# Patient Record
Sex: Male | Born: 1935
Health system: Southern US, Community
[De-identification: ages and names within clinical notes are randomized; demographics above are authoritative.]

## PROBLEM LIST (undated history)

## (undated) DIAGNOSIS — I10 Essential (primary) hypertension: Secondary | ICD-10-CM

## (undated) DIAGNOSIS — Z8719 Personal history of other diseases of the digestive system: Secondary | ICD-10-CM

## (undated) DIAGNOSIS — Z9889 Other specified postprocedural states: Secondary | ICD-10-CM

## (undated) DIAGNOSIS — I519 Heart disease, unspecified: Secondary | ICD-10-CM

## (undated) DIAGNOSIS — I499 Cardiac arrhythmia, unspecified: Secondary | ICD-10-CM

## (undated) DIAGNOSIS — G4730 Sleep apnea, unspecified: Secondary | ICD-10-CM

## (undated) HISTORY — PX: HERNIA REPAIR: SHX51

## (undated) HISTORY — DX: Sleep apnea, unspecified: G47.30

## (undated) HISTORY — DX: Cardiac arrhythmia, unspecified: I49.9

## (undated) HISTORY — DX: Other specified postprocedural states: Z98.890

## (undated) HISTORY — DX: Personal history of other diseases of the digestive system: Z87.19

## (undated) HISTORY — DX: Essential (primary) hypertension: I10

## (undated) HISTORY — DX: Heart disease, unspecified: I51.9

---

## 2015-02-24 DIAGNOSIS — Z23 Encounter for immunization: Secondary | ICD-10-CM | POA: Diagnosis not present

## 2015-02-25 DIAGNOSIS — I471 Supraventricular tachycardia: Secondary | ICD-10-CM | POA: Diagnosis not present

## 2015-02-25 DIAGNOSIS — I10 Essential (primary) hypertension: Secondary | ICD-10-CM | POA: Diagnosis not present

## 2015-02-25 DIAGNOSIS — R54 Age-related physical debility: Secondary | ICD-10-CM | POA: Diagnosis not present

## 2015-02-25 DIAGNOSIS — R0602 Shortness of breath: Secondary | ICD-10-CM | POA: Diagnosis not present

## 2015-10-19 DIAGNOSIS — Z23 Encounter for immunization: Secondary | ICD-10-CM | POA: Diagnosis not present

## 2015-12-21 DIAGNOSIS — H5213 Myopia, bilateral: Secondary | ICD-10-CM | POA: Diagnosis not present

## 2015-12-21 DIAGNOSIS — H532 Diplopia: Secondary | ICD-10-CM | POA: Diagnosis not present

## 2016-05-26 DIAGNOSIS — I10 Essential (primary) hypertension: Secondary | ICD-10-CM | POA: Diagnosis not present

## 2016-05-26 DIAGNOSIS — Z9989 Dependence on other enabling machines and devices: Secondary | ICD-10-CM | POA: Diagnosis not present

## 2016-05-26 DIAGNOSIS — R54 Age-related physical debility: Secondary | ICD-10-CM | POA: Diagnosis not present

## 2016-05-26 DIAGNOSIS — I471 Supraventricular tachycardia: Secondary | ICD-10-CM | POA: Diagnosis not present

## 2016-05-26 DIAGNOSIS — R0602 Shortness of breath: Secondary | ICD-10-CM | POA: Diagnosis not present

## 2016-05-26 DIAGNOSIS — G4733 Obstructive sleep apnea (adult) (pediatric): Secondary | ICD-10-CM | POA: Diagnosis not present

## 2016-12-19 DIAGNOSIS — I10 Essential (primary) hypertension: Secondary | ICD-10-CM | POA: Diagnosis not present

## 2016-12-19 DIAGNOSIS — G4733 Obstructive sleep apnea (adult) (pediatric): Secondary | ICD-10-CM | POA: Diagnosis not present

## 2016-12-19 DIAGNOSIS — Z9989 Dependence on other enabling machines and devices: Secondary | ICD-10-CM | POA: Diagnosis not present

## 2017-05-25 ENCOUNTER — Ambulatory Visit (INDEPENDENT_AMBULATORY_CARE_PROVIDER_SITE_OTHER): Payer: Medicare Other | Admitting: Family Medicine

## 2017-05-25 ENCOUNTER — Encounter: Payer: Self-pay | Admitting: Family Medicine

## 2017-05-25 VITALS — BP 118/86 | HR 64 | Temp 98.0°F | Resp 22 | Ht 65.0 in | Wt 210.0 lb

## 2017-05-25 DIAGNOSIS — H9193 Unspecified hearing loss, bilateral: Secondary | ICD-10-CM

## 2017-05-25 DIAGNOSIS — I4891 Unspecified atrial fibrillation: Secondary | ICD-10-CM | POA: Insufficient documentation

## 2017-05-25 DIAGNOSIS — I499 Cardiac arrhythmia, unspecified: Secondary | ICD-10-CM

## 2017-05-25 DIAGNOSIS — J302 Other seasonal allergic rhinitis: Secondary | ICD-10-CM | POA: Insufficient documentation

## 2017-05-25 NOTE — Progress Notes (Signed)
Chief Complaint  Patient presents with  . New Patient (Initial Visit)    Establish care with local MD; goes to [facility]       New Patient Visit SUBJECTIVE: HPI: Derrick Spencer is an 84 y.o.male who is being seen for establishing care.  The patient was previously seen at [facility]. Here w daughter.   Pt has a hx of irreg heart beat. Does not remember who told him this or what is done. He does not follow with a cardiologist. Last EKG was around 2 years ago. He will sometimes get short of breath, none currently. No palpitations or chest pain. Reports compliance with his meds, all of which he gets from [place].  He will also wake up iwht a runny nose and drainage in his throat making him cough. He does have a hx of allergies, but does not take anything. He would like something.   Pt is HOH.   Past Medical History:  Diagnosis Date  . Cardiac arrhythmia   . H/O hernia repair   . Heart disease   . High blood pressure   . Sleep apnea    Family History  Problem Relation Age of Onset  . Arthritis Mother   . Hearing loss Mother   . Arthritis Father   . Diabetes Daughter      No Known Allergies  Current Outpatient Medications:  .  Ascorbic Acid (VITAMIN C) 100 MG tablet, Take 100 mg by mouth daily., Disp: , Rfl:  .  aspirin EC 81 MG tablet, Take 81 mg by mouth daily., Disp: , Rfl:  .  furosemide (LASIX) 20 MG tablet, Take 10 mg by mouth daily., Disp: , Rfl:  .  Omega-3 Fatty Acids (FISH OIL) 1000 MG CAPS, Take by mouth daily., Disp: , Rfl:  .  Omeprazole 20 MG TBEC, Take 20 mg by mouth daily as needed., Disp: , Rfl:  .  tamsulosin (FLOMAX) 0.4 MG CAPS capsule, Take 0.4 mg by mouth daily., Disp: , Rfl:  .  valsartan (DIOVAN) 40 MG tablet, Take 40 mg by mouth 2 (two) times daily. Meds delivered as  tabs, pt. Cuts in half, Disp: , Rfl:   ROS Cardiovascular: Denies chest pain  Respiratory: Denies dyspnea   OBJECTIVE: BP 118/86 (BP Location: Right Arm, Patient Position:  Sitting, Cuff Size: Large)   Pulse 64 Comment: irregular  Temp 98 F (36.7 C) (Oral)   Resp (!) 22   Ht  (1.651 m)   Wt 210 lb (95.3 kg)   SpO2 95%   BMI 34.95 kg/m   Constitutional: -  VS reviewed -  Well developed, well nourished, appears stated age -  No apparent distress  Psychiatric: -  Oriented to person, place, and time -  Memory intact -  Affect and mood normal -  Fluent conversation, good eye contact -  Judgment and insight age appropriate  Eye: -  Conjunctivae clear, no discharge -  Pupils symmetric, round, reactive to light  ENMT: -  MMM    Pharynx moist, no exudate, no erythema -  Ears patent, TM's with sclerosis b/l  Neck: -  No gross swelling, no palpable masses -  Thyroid midline, not enlarged, mobile, no palpable masses  Cardiovascular: -  Rate reg, irreg irreg rhythm -  2+ pitting LE edema up to prox 1/3 of tibia bl  Respiratory: -  Normal respiratory effort, no accessory muscle use, no retraction -  Breath sounds equal, no wheezes, no ronchi, no  crackles  Skin: -  No significant lesion on inspection -  Warm and dry to palpation   ASSESSMENT/PLAN: Seasonal allergies  Atrial fibrillation, unspecified type (HCC)  Irregular heartbeat - Plan: EKG 12-Lead  Bilateral hearing loss, unspecified hearing loss type  Patient instructed to sign release of records form from his previous PCP. OTC options for #1 written down. Renal function reviewed from labwork he brought in and GFR >60.  EKG shows atrial fibrillation. He has a cardiologist, pt and daughter rec'd to contact that provider today to see if he already knows about or would like him to be seen. As this has been going on for years, I will hold off on adding anticoagulation without seeing records.  I fear bleeding with him more than anything else due to duration of issue.  [facility]  The patient and his daughter voiced understanding and agreement to  the plan.   Jilda Roche Ansley, DO 05/25/17  10:50 AM

## 2017-05-25 NOTE — Patient Instructions (Addendum)
Claritin (loratadine), Allegra (fexofenadine), Zyrtec (cetirizine); these are listed in order from weakest to strongest. Generic, and therefore cheaper, options are in the parentheses.   There are available OTC, and the generic versions, which may be cheaper, are in parentheses. Show this to a pharmacist if you have trouble finding any of these items.  Call your cardiologist to make sure he knows about the atrial fibrillation/irregular heartbeat.   Let us know if you need anything.  Diastasis Recti Diastasis recti is when the muscles of the abdomen (rectus abdominis muscles) become thin and separate. The result is a wider space between the right and left abdomen (abdominal) muscles. This wider space between the muscles may cause a bulge in the middle of your abdomen. You may notice this bulge when you are straining or when you sit up from a lying down position. Diastasis recti can affect men and women. It is most common among pregnant women, infants, people who are obese, and people who have had abdominal surgery. Exercise or surgical treatment may help correct it. What are the causes? Common causes of this condition include:  Pregnancy. The growing uterus puts pressure on the abdominal muscles, which causes the muscles to separate.  Obesity. Excess fat puts pressure on abdominal muscles.  Weightlifting.  Some abdomen exercises.  Advanced age.  Genetics.  Prior abdominal surgery.  What increases the risk? This condition is more likely to develop in:  Women.  Newborns, especially newborns who are born early (prematurely).  What are the signs or symptoms? Common symptoms of this condition include:  A bulge in the middle of the abdomen. You will notice it most when you sit up or strain.  Pain in the low back, pelvis, or hips.  Constipation.  Inability to control when you urinate (urinary incontinence).  Bloating.  Poor posture.  How is this diagnosed? This condition is  diagnosed with a physical exam. Your health care provider will ask you to lie flat on your back and do a crunch or half sit-up. If you have diastasis recti, a vertical bulge will appear between your abdominal muscles in the center of your abdomen. Your health care provider will measure the gap between your muscles with one of the following:  A medical device used to measure the space between two objects (caliper).  A tape measure.  CT scan.  Ultrasound.  Finger spaces. Your health care provider will measure the space using their fingers.  How is this treated? If your muscle separation is not too large, you may not need treatment. However, if you are a woman who plans to become pregnant again, you should treat this condition before your next pregnancy. Treatment may include:  Physical therapy to strengthen and tighten your abdominal muscles.  Lifestyle changes such as weight loss and exercise.  Over-the-counter pain medicines as needed.  Surgery to correct the separation.  Follow these instructions at home: Activity  Return to your normal activities as told by your health care provider. Ask your health care provider what activities are safe for you.  When lifting weights or doing exercises using your abdominal muscles or the muscles in the center of your body that give stability (core muscles), make sure you are doing your exercises and movements correctly. Proper form can help to prevent the condition from happening again. General instructions  If you are overweight, ask your health care provider for help with weight loss. Losing even a small amount of weight can help to improve your diastasis recti.  Take over-the-counter or prescription medicines only as told by your health care provider.  Do not strain. Straining can make the separation worse. Examples of straining include: ? Pushing hard to have a bowel movement, such as due to constipation. ? Lifting heavy objects, including  children. ? Standing up and sitting down.  Take steps to prevent constipation: ? Drink enough fluid to keep your urine clear or pale yellow. ? Take over-the-counter or prescription medicines only as directed. ? Eat foods that are high in fiber, such as fresh fruits and vegetables, whole grains, and beans. ? Limit foods that are high in fat and processed sugars, such as fried and sweet foods. Contact a health care provider if:  You notice a new bulge in your abdomen. Get help right away if:  You experience severe discomfort in your abdomen.  You develop severe abdominal pain along with nausea, vomiting, or fever. Summary  Diastasis recti is when the abdomen (abdominal) muscles become thin and separate. Your abdomen will stick out because the space between your right and left abdomen muscles has widened.  The most common symptom is a bulge in your abdomen. You will notice it most when you sit up or are straining.  This condition is diagnosed during a physical exam.  If the abdomen separation is not too big, you may choose not to have treatment. Otherwise, you may need to undergo physical therapy or surgery. This information is not intended to replace advice given to you by your health care provider. Make sure you discuss any questions you have with your health care provider. Document Released: 03/06/2016 Document Revised: 03/06/2016 Document Reviewed: 03/06/2016 Elsevier Interactive Patient Education  [employer].

## 2017-06-06 NOTE — Progress Notes (Addendum)
Subjective:   Derrick Spencer is a 84 y.o. male who presents for Medicare Annual Initial preventive examination. The Patient was informed that the wellness visit is to identify future health risk and educate and initiate measures that can reduce risk for increased disease through the lifespan.   Describes health as fair, good or great? "Great for 89" per pt.    Review of Systems: No ROS.  Medicare Wellness Visit. Additional risk factors are reflected in the social history.   Sleep patterns: wears CPAP. Wakes 2-3x/night. Naps 2 hrs daily Home Safety/Smoke Alarms: Feels safe in home. Smoke alarms in place.  Living environment; residence and Firearm Safety: Lives with wife in 1 story home. Walk in shower.   Male:   PSA- No results found for: PSA      Objective:    Vitals: BP 120/60 (BP Location: Left Arm, Patient Position: Sitting, Cuff Size: Normal)   Pulse 78   Ht  (1.651 m)   Wt 210 lb (95.3 kg)   SpO2 96%   BMI 34.95 kg/m   Body mass index is 34.95 kg/m.  Advanced Directives 06/11/2017  Does Patient Have a Medical Advance Directive? Yes  Type of Estate agent of [place];Living will  Does patient want to make changes to medical advance directive? No - Patient declined  Copy of Healthcare Power of Attorney in Chart? No - copy requested    Tobacco Social History   Tobacco Use  Smoking Status Never Smoker  Smokeless Tobacco Never Used     Counseling given: Not Answered   Clinical Intake: Pain : No/denies pain    Past Medical History:  Diagnosis Date  . Cardiac arrhythmia   . H/O hernia repair   . Heart disease   . High blood pressure   . Sleep apnea    CPAP   Past Surgical History:  Procedure Laterality Date  . HERNIA REPAIR     Family History  Problem Relation Age of Onset  . Arthritis Mother   . Hearing loss Mother   . Arthritis Father   . Diabetes Daughter    Social History   Socioeconomic History  . Marital  status: Married    Spouse name: Not on file  . Number of children: Not on file  . Years of education: Not on file  . Highest education level: Not on file  Occupational History  . Not on file  Social Needs  . Financial resource strain: Not on file  . Food insecurity:    Worry: Not on file    Inability: Not on file  . Transportation needs:    Medical: Not on file    Non-medical: Not on file  Tobacco Use  . Smoking status: Never Smoker  . Smokeless tobacco: Never Used  Substance and Sexual Activity  . Alcohol use: Never    Frequency: Never  . Drug use: Never  . Sexual activity: Not on file  Lifestyle  . Physical activity:    Days per week: Not on file    Minutes per session: Not on file  . Stress: Not on file  Relationships  . Social connections:    Talks on phone: Not on file    Gets together: Not on file    Attends religious service: Not on file    Active member of club or organization: Not on file    Attends meetings of clubs or organizations: Not on file    Relationship status: Not on  file  Other Topics Concern  . Not on file  Social History Narrative  . Not on file    Outpatient Encounter Medications as of 06/11/2017  Medication Sig  . Ascorbic Acid (VITAMIN C) 100 MG tablet Take 100 mg by mouth daily.  Marland Kitchen aspirin EC 81 MG tablet Take 81 mg by mouth daily.  . furosemide (LASIX) 20 MG tablet Take 10 mg by mouth daily.  . Omega-3 Fatty Acids (FISH OIL) 1000 MG CAPS Take by mouth daily.  . Omeprazole 20 MG TBEC Take 20 mg by mouth daily as needed.  . tamsulosin (FLOMAX) 0.4 MG CAPS capsule Take 0.4 mg by mouth daily.  . valsartan (DIOVAN) 40 MG tablet Take 40 mg by mouth 2 (two) times daily. Meds delivered as  tabs, pt. Cuts in half   No facility-administered encounter medications on file as of 06/11/2017.     Activities of Daily Living In your present state of health, do you have any difficulty performing the following activities: 06/11/2017 05/25/2017  Hearing?  Malvin Johns  Vision? N N  Comment pt states he has lens implants. -  Difficulty concentrating or making decisions? N N  Walking or climbing stairs? N N  Dressing or bathing? N N  Doing errands, shopping? N N  Preparing Food and eating ? N -  Using the Toilet? N -  In the past six months, have you accidently leaked urine? N -  Do you have problems with loss of bowel control? N -  Managing your Medications? N -  Managing your Finances? N -  Housekeeping or managing your Housekeeping? N -  Comment has Programmer, applications. -    Patient Care Team: Sharlene Dory, DO as PCP - General (Family Medicine)   Assessment:   This is a routine wellness examination for Derrick Spencer. Physical assessment deferred to PCP.  Exercise Activities and Dietary recommendations   Diet (meal preparation, eat out, water intake, caffeinated beverages, dairy products, fruits and vegetables): 24 hr recall Breakfast: cornflakes and fruit. coffee Lunch: sandwich or veg plate Dinner: salad and ice cream.     Goals    . maintain independence       Fall Risk Fall Risk  06/11/2017 05/25/2017  Falls in the past year? No No    Depression Screen PHQ 2/9 Scores 06/11/2017 05/25/2017  PHQ - 2 Score 0 1    Cognitive Function  Pt is alert and oriented x3. Answers questions appropriately.         There is no immunization history on file for this patient.   Screening Tests Health Maintenance  Topic Date Due  . TETANUS/TDAP  04/07/1939  . PNA vac Low Risk Adult (1 of 2 - PCV13) 04/08/1987  . INFLUENZA VACCINE  08/23/2017      Plan:     Please schedule your next medicare wellness visit with me in 1 yr.  Bring a copy of your living will and/or healthcare power of attorney to your next office visit.  Continue to eat heart healthy diet (full of fruits, vegetables, whole grains, lean protein, water--limit salt, fat, and sugar intake) and increase physical activity as tolerated.   I have personally reviewed and noted  the following in the patient's chart:   . Medical and social history . Use of alcohol, tobacco or illicit drugs  . Current medications and supplements . Functional ability and status . Nutritional status . Physical activity . Advanced directives . List of other physicians . Hospitalizations, surgeries,  and ER visits in previous 12 months . Vitals . Screenings to include cognitive, depression, and falls . Referrals and appointments  In addition, I have reviewed and discussed with patient certain preventive protocols, quality metrics, and best practice recommendations. A written personalized care plan for preventive services as well as general preventive health recommendations were provided to patient.     Avon Gully, RN  06/11/2017   I have reviewed the above MWE note by Ms. Marche Hottenstein and agree with her documentation

## 2017-06-08 ENCOUNTER — Ambulatory Visit: Payer: Medicare Other | Admitting: *Deleted

## 2017-06-11 ENCOUNTER — Encounter: Payer: Self-pay | Admitting: *Deleted

## 2017-06-11 ENCOUNTER — Ambulatory Visit (INDEPENDENT_AMBULATORY_CARE_PROVIDER_SITE_OTHER): Payer: Medicare Other | Admitting: *Deleted

## 2017-06-11 VITALS — BP 120/60 | HR 78 | Ht 65.0 in | Wt 210.0 lb

## 2017-06-11 DIAGNOSIS — Z0000 Encounter for general adult medical examination without abnormal findings: Secondary | ICD-10-CM | POA: Diagnosis not present

## 2017-06-11 NOTE — Patient Instructions (Signed)
Please schedule your next medicare wellness visit with me in 1 yr.  Bring a copy of your living will and/or healthcare power of attorney to your next office visit.  Continue to eat heart healthy diet (full of fruits, vegetables, whole grains, lean protein, water--limit salt, fat, and sugar intake) and increase physical activity as tolerated.   Derrick Spencer , Thank you for taking time to come for your Medicare Wellness Visit. I appreciate your ongoing commitment to your health goals. Please review the following plan we discussed and let me know if I can assist you in the future.   These are the goals we discussed: Goals    . maintain independence       This is a list of the screening recommended for you and due dates:  Health Maintenance  Topic Date Due  . Tetanus Vaccine  04/07/1939  . Pneumonia vaccines (1 of 2 - PCV13) 04/08/1987  . Flu Shot  08/23/2017    Health Maintenance, Male A healthy lifestyle and preventive care is important for your health and wellness. Ask your health care provider about what schedule of regular examinations is right for you. What should I know about weight and diet? Eat a Healthy Diet  Eat plenty of vegetables, fruits, whole grains, low-fat dairy products, and lean protein.  Do not eat a lot of foods high in solid fats, added sugars, or salt.  Maintain a Healthy Weight Regular exercise can help you achieve or maintain a healthy weight. You should:  Do at least 150 minutes of exercise each week. The exercise should increase your heart rate and make you sweat (moderate-intensity exercise).  Do strength-training exercises at least twice a week.  Watch Your Levels of Cholesterol and Blood Lipids  Have your blood tested for lipids and cholesterol every 5 years starting at 82 years of age. If you are at high risk for heart disease, you should start having your blood tested when you are 82 years old. You may need to have your cholesterol levels checked  more often if: ? Your lipid or cholesterol levels are high. ? You are older than 82 years of age. ? You are at high risk for heart disease.  What should I know about cancer screening? Many types of cancers can be detected early and may often be prevented. Lung Cancer  You should be screened every year for lung cancer if: ? You are a current smoker who has smoked for at least 30 years. ? You are a former smoker who has quit within the past 15 years.  Talk to your health care provider about your screening options, when you should start screening, and how often you should be screened.  Colorectal Cancer  Routine colorectal cancer screening usually begins at 82 years of age and should be repeated every 5-10 years until you are 82 years old. You may need to be screened more often if early forms of precancerous polyps or small growths are found. Your health care provider may recommend screening at an earlier age if you have risk factors for colon cancer.  Your health care provider may recommend using home test kits to check for hidden blood in the stool.  A small camera at the end of a tube can be used to examine your colon (sigmoidoscopy or colonoscopy). This checks for the earliest forms of colorectal cancer.  Prostate and Testicular Cancer  Depending on your age and overall health, your health care provider may do certain tests to  screen for prostate and testicular cancer.  Talk to your health care provider about any symptoms or concerns you have about testicular or prostate cancer.  Skin Cancer  Check your skin from head to toe regularly.  Tell your health care provider about any new moles or changes in moles, especially if: ? There is a change in a mole's size, shape, or color. ? You have a mole that is larger than a pencil eraser.  Always use sunscreen. Apply sunscreen liberally and repeat throughout the day.  Protect yourself by wearing long sleeves, pants, a wide-brimmed hat,  and sunglasses when outside.  What should I know about heart disease, diabetes, and high blood pressure?  If you are 37-59 years of age, have your blood pressure checked every 3-5 years. If you are 65 years of age or older, have your blood pressure checked every year. You should have your blood pressure measured twice-once when you are at a hospital or clinic, and once when you are not at a hospital or clinic. Record the average of the two measurements. To check your blood pressure when you are not at a hospital or clinic, you can use: ? An automated blood pressure machine at a pharmacy. ? A home blood pressure monitor.  Talk to your health care provider about your target blood pressure.  If you are between 51-15 years old, ask your health care provider if you should take aspirin to prevent heart disease.  Have regular diabetes screenings by checking your fasting blood sugar level. ? If you are at a normal weight and have a low risk for diabetes, have this test once every three years after the age of 36. ? If you are overweight and have a high risk for diabetes, consider being tested at a younger age or more often.  A one-time screening for abdominal aortic aneurysm (AAA) by ultrasound is recommended for men aged 80-75 years who are current or former smokers. What should I know about preventing infection? Hepatitis B If you have a higher risk for hepatitis B, you should be screened for this virus. Talk with your health care provider to find out if you are at risk for hepatitis B infection. Hepatitis C Blood testing is recommended for:  Everyone born from [birth year] through 1963.  Anyone with known risk factors for hepatitis C.  Sexually Transmitted Diseases (STDs)  You should be screened each year for STDs including gonorrhea and chlamydia if: ? You are sexually active and are younger than 82 years of age. ? You are older than 82 years of age and your health care provider tells you that you  are at risk for this type of infection. ? Your sexual activity has changed since you were last screened and you are at an increased risk for chlamydia or gonorrhea. Ask your health care provider if you are at risk.  Talk with your health care provider about whether you are at high risk of being infected with HIV. Your health care provider may recommend a prescription medicine to help prevent HIV infection.  What else can I do?  Schedule regular health, dental, and eye exams.  Stay current with your vaccines (immunizations).  Do not use any tobacco products, such as cigarettes, chewing tobacco, and e-cigarettes. If you need help quitting, ask your health care provider.  Limit alcohol intake to no more than 2 drinks per day. One drink equals 12 ounces of beer, 5 ounces of wine, or 1 ounces of hard liquor.  Do not use street drugs.  Do not share needles.  Ask your health care provider for help if you need support or information about quitting drugs.  Tell your health care provider if you often feel depressed.  Tell your health care provider if you have ever been abused or do not feel safe at home. This information is not intended to replace advice given to you by your health care provider. Make sure you discuss any questions you have with your health care provider. Document Released: 07/08/2007 Document Revised: 09/08/2015 Document Reviewed: 10/13/2014 Elsevier Interactive Patient Education  [employer].

## 2017-11-05 DIAGNOSIS — Z23 Encounter for immunization: Secondary | ICD-10-CM | POA: Diagnosis not present

## 2017-12-10 ENCOUNTER — Ambulatory Visit (INDEPENDENT_AMBULATORY_CARE_PROVIDER_SITE_OTHER): Payer: Medicare Other

## 2017-12-10 ENCOUNTER — Ambulatory Visit (INDEPENDENT_AMBULATORY_CARE_PROVIDER_SITE_OTHER): Payer: Medicare Other | Admitting: Family Medicine

## 2017-12-10 ENCOUNTER — Encounter: Payer: Self-pay | Admitting: Family Medicine

## 2017-12-10 VITALS — BP 138/76 | HR 52 | Temp 97.6°F | Ht 65.0 in | Wt 218.0 lb

## 2017-12-10 DIAGNOSIS — R05 Cough: Secondary | ICD-10-CM | POA: Diagnosis not present

## 2017-12-10 DIAGNOSIS — R0602 Shortness of breath: Secondary | ICD-10-CM

## 2017-12-10 DIAGNOSIS — I509 Heart failure, unspecified: Secondary | ICD-10-CM

## 2017-12-10 DIAGNOSIS — R059 Cough, unspecified: Secondary | ICD-10-CM

## 2017-12-10 DIAGNOSIS — M545 Low back pain, unspecified: Secondary | ICD-10-CM

## 2017-12-10 LAB — BASIC METABOLIC PANEL
BUN: 18 mg/dL (ref 6–23)
CALCIUM: 9 mg/dL (ref 8.4–10.5)
CO2: 30 mEq/L (ref 19–32)
Chloride: 102 mEq/L (ref 96–112)
Creatinine, Ser: 0.99 mg/dL (ref 0.40–1.50)
GFR: 74.55 mL/min (ref >60.00)
GLUCOSE: 115 mg/dL — AB (ref 70–99)
Potassium: 4.1 mEq/L (ref 3.5–5.1)
SODIUM: 139 meq/L (ref 135–145)

## 2017-12-10 LAB — POCT URINALYSIS DIPSTICK
Bilirubin, UA: NEGATIVE
Blood, UA: NEGATIVE
Glucose, UA: NEGATIVE
Ketones, UA: NEGATIVE
LEUKOCYTES UA: NEGATIVE
NITRITE UA: NEGATIVE
PH UA: 6 (ref 5.0–8.0)
PROTEIN UA: NEGATIVE
Spec Grav, UA: 1.015 (ref 1.010–1.025)
UROBILINOGEN UA: 0.2 U/dL

## 2017-12-10 NOTE — Assessment & Plan Note (Signed)
-  History and clinical exam consistent with CHF exacerbation.  -He will increase furosemide to 40mg  daily -Check BMP for baseline potassium and renal function.  -CXR ordered -Instructed to weight daily and follow low salt diet.  -Instructed to f/u with cardiologist.  -F/u with PCP in 1 week or sooner for worsening symptoms.

## 2017-12-10 NOTE — Assessment & Plan Note (Signed)
Check UA 

## 2017-12-10 NOTE — Progress Notes (Signed)
Derrick Spencer - 84 y.o. male MRN [id number]  Date of birth: 18-Feb-1920  Subjective Chief Complaint  Patient presents with  . Cough    Ongoing for one week-admits to productive cough. Denies wheezing. He has been taking Alka-seltzer. Denies fevers  . Hip Pain    Right-noticed the pain yesterday. Denies injury or falls.    HPI 84 year old patient presents today accompanied by daughter for cough that started 1 week ago. Patient reports cough to be productive occasionally that is white, yellow. Pt denies fevers, body aches, congestion, or N/V/D. Pt and daughter report history of congestive heart failure but no recent ECHO available. Previous EKG showed atrial fibrillation. Pt states it has been at least a year since being seen by cardiologist. Patient reports occasional shortness of breath. He denies chest pain, pressure, or palpitations.   Additionally, patient reports lower back pain that started 2 days ago. He denies dysuria or hematuria. He denies urinary frequency or urgency.    No Known Allergies  Past Medical History:  Diagnosis Date  . Cardiac arrhythmia   . H/O hernia repair   . Heart disease   . High blood pressure   . Sleep apnea    CPAP    Past Surgical History:  Procedure Laterality Date  . HERNIA REPAIR      Social History   Socioeconomic History  . Marital status: Married    Spouse name: Not on file  . Number of children: Not on file  . Years of education: Not on file  . Highest education level: Not on file  Occupational History  . Not on file  Social Needs  . Financial resource strain: Not on file  . Food insecurity:    Worry: Not on file    Inability: Not on file  . Transportation needs:    Medical: Not on file    Non-medical: Not on file  Tobacco Use  . Smoking status: Never Smoker  . Smokeless tobacco: Never Used  Substance and Sexual Activity  . Alcohol use: Never    Frequency: Never  . Drug use: Never  . Sexual activity: Not on file    Lifestyle  . Physical activity:    Days per week: Not on file    Minutes per session: Not on file  . Stress: Not on file  Relationships  . Social connections:    Talks on phone: Not on file    Gets together: Not on file    Attends religious service: Not on file    Active member of club or organization: Not on file    Attends meetings of clubs or organizations: Not on file    Relationship status: Not on file  Other Topics Concern  . Not on file  Social History Narrative  . Not on file    Family History  Problem Relation Age of Onset  . Arthritis Mother   . Hearing loss Mother   . Arthritis Father   . Diabetes Daughter     Health Maintenance  Topic Date Due  . Derrick Spencer  04/07/1939  . INFLUENZA VACCINE  Completed  . PNA vac Low Risk Adult  Completed   ROS General: Pt reports decreased energy level. Pt denies taking weights and unsure about weight gain or loss.  Cardiac: Pt denies chest pain, pressure, or palpitations. Pt denies syncope or dizziness.  Pulmonary: Pt reports sleeping on one pillow at night.  GU: see HPI   ----------------------------------------------------------------------------------------------------------------------------------------------------------------------------------------------------------------- Physical Exam BP 138/76  Pulse (!) 52   Temp 97.6 F (36.4 C) (Oral)   Ht 5\' 5"  (1.651 m)   Wt 218 lb (98.9 kg)   SpO2 98%   BMI 36.28 kg/m   Physical Exam  Constitutional: He appears well-developed and well-nourished.  HENT:  Mouth/Throat: Oropharynx is clear and moist. No oropharyngeal exudate.  Eyes: Conjunctivae are normal.  Cardiovascular: Normal heart sounds. An irregular rhythm present.  Pulmonary/Chest: No respiratory distress. He has rales in the right lower field and the left lower field.  Lymphadenopathy:    He has no cervical adenopathy.     ------------------------------------------------------------------------------------------------------------------------------------------------------------------------------------------------------------------- Assessment and Plan  Acute on chronic congestive heart failure -Increase lasix to 40mg  daily -Labs: BMP, chest x ray -Begin taking daily weight. Please notify if weights are increasing.  -Follow up with cardiology -Follow up with PCP in 1 week. Return sooner if symptoms worsen or do not improve. Warning signs and symptoms reviewed.   Low back pain -U/a

## 2017-12-10 NOTE — Patient Instructions (Addendum)
-  Increase your furosemide to 40mg  daily -Check weights daily.  Please let me know if your weight is going up.  -Follow a low salt diet. -We'll call with xray results.

## 2017-12-10 NOTE — Progress Notes (Signed)
Derrick Spencer - 84 y.o. male MRN [id number]  Date of birth: December 04, 1920  Subjective Chief Complaint  Patient presents with  . Cough    Ongoing for one week-admits to productive cough. Denies wheezing. He has been taking Alka-seltzer. Denies fevers  . Hip Pain    Right-noticed the pain yesterday. Denies injury or falls.    HPI Derrick Spencer is a 84 y.o. male here today with complaint of cough.  He reports that cough started about 1 week ago.  It is productive of white-light yellow sputum.  He also feels a little short of breath.  He has history of A. Fib based on EKG from 05/25/17.  He also reports diagnosis of CHF, however I do not see recent ECHO.  He is followed by cardiology, it appears last visit was 05/2016 and was seen for PSVT.  I do not see prior diagnosis of A. Fib before visit with PCP on 05/25/17.  He does receive medications from [facility] pharmacy and is taking furosemide 20mg  (prescribed as 10mg ) nightly and valsartan.  He denies chest pain, fever, chills, dizziness, palpitations, wheezing,  orthopnea or increased fatigue.   He also reports some pain in R lower back/posterior hip area.  Denies pain with urination or increased frequency.   ROS:  A comprehensive ROS was completed and negative except as noted per HPI  No Known Allergies  Past Medical History:  Diagnosis Date  . Cardiac arrhythmia   . H/O hernia repair   . Heart disease   . High blood pressure   . Sleep apnea    CPAP    Past Surgical History:  Procedure Laterality Date  . HERNIA REPAIR      Social History   Socioeconomic History  . Marital status: Married    Spouse name: Not on file  . Number of children: Not on file  . Years of education: Not on file  . Highest education level: Not on file  Occupational History  . Not on file  Social Needs  . Financial resource strain: Not on file  . Food insecurity:    Worry: Not on file    Inability: Not on file  . Transportation needs:    Medical: Not on file   Non-medical: Not on file  Tobacco Use  . Smoking status: Never Smoker  . Smokeless tobacco: Never Used  Substance and Sexual Activity  . Alcohol use: Never    Frequency: Never  . Drug use: Never  . Sexual activity: Not on file  Lifestyle  . Physical activity:    Days per week: Not on file    Minutes per session: Not on file  . Stress: Not on file  Relationships  . Social connections:    Talks on phone: Not on file    Gets together: Not on file    Attends religious service: Not on file    Active member of club or organization: Not on file    Attends meetings of clubs or organizations: Not on file    Relationship status: Not on file  Other Topics Concern  . Not on file  Social History Narrative  . Not on file    Family History  Problem Relation Age of Onset  . Arthritis Mother   . Hearing loss Mother   . Arthritis Father   . Diabetes Daughter     Health Maintenance  Topic Date Due  . Janet Berlin  04/07/1939  . INFLUENZA VACCINE  Completed  . PNA  vac Low Risk Adult  Completed    ----------------------------------------------------------------------------------------------------------------------------------------------------------------------------------------------------------------- Physical Exam BP 138/76   Pulse (!) 52   Temp 97.6 F (36.4 C) (Oral)   Ht 5\' 5"  (1.651 m)   Wt 218 lb (98.9 kg)   SpO2 98%   BMI 36.28 kg/m   Physical Exam  Constitutional: He is oriented to person, place, and time. He appears well-nourished. No distress.  HENT:  Head: Normocephalic and atraumatic.  Mouth/Throat: Oropharynx is clear and moist.  Eyes: No scleral icterus.  Neck: Neck supple. No thyromegaly present.  Cardiovascular: An irregularly irregular rhythm present.  Pulmonary/Chest: Effort normal.  Bibasilar crackles   Lymphadenopathy:    He has no cervical adenopathy.  Neurological: He is alert and oriented to person, place, and time.  Skin: Skin is warm and dry.   Psychiatric: He has a normal mood and affect. His behavior is normal.    ------------------------------------------------------------------------------------------------------------------------------------------------------------------------------------------------------------------- Assessment and Plan  CHF exacerbation (HCC) -History and clinical exam consistent with CHF exacerbation.  -He will increase furosemide to 40mg  daily -Check BMP for baseline potassium and renal function.  -CXR ordered -Instructed to weight daily and follow low salt diet.  -Instructed to f/u with cardiologist.  -F/u with PCP in 1 week or sooner for worsening symptoms.   Acute low back pain -Check UA

## 2017-12-11 ENCOUNTER — Telehealth: Payer: Self-pay | Admitting: Emergency Medicine

## 2017-12-11 NOTE — Progress Notes (Signed)
Urine, kidney function and CXR look ok.  Recommend that he continue increased lasix for now and f/u with PCP next week.

## 2017-12-11 NOTE — Telephone Encounter (Signed)
Copied from CRM [phone]. Topic: Quick Communication - Lab Results (Clinic Use ONLY) >> Dec 11, 2017  9:17 AM Leafy Roobinson, Norma J wrote: Pt daughter Darel Hongjudy is calling she in on DPR and would like to know the xray results and urine test results. Dr saw dr Ashley Royaltymatthews yesterday   Left a VM for daughter to give the office a call back.

## 2017-12-12 ENCOUNTER — Ambulatory Visit: Payer: Medicare Other | Admitting: Family Medicine

## 2018-06-11 ENCOUNTER — Telehealth: Payer: Self-pay | Admitting: Family Medicine

## 2018-06-11 NOTE — Telephone Encounter (Signed)
His Daughter called back and stated he is doing better and they think just hemorrhoids

## 2018-06-11 NOTE — Telephone Encounter (Signed)
Noted  

## 2018-06-11 NOTE — Telephone Encounter (Signed)
Pt's daughter is needing advice on what to do concerning pt's issue, states pt has blood in stool and has had it once before and it was a hemmrhoid pt unable to do a doxy.me because daughter is not home with him at this time. Please advise Please call daughter Derrick Spencer) at [phone] she is on his HIPPA

## 2018-06-11 NOTE — Telephone Encounter (Signed)
Yes, please. Ty.

## 2018-06-11 NOTE — Telephone Encounter (Signed)
Last appt with PCP 05/2017 Have not called this patient yet do you want to see in the office on Wednesday?

## 2018-06-13 ENCOUNTER — Ambulatory Visit: Payer: Medicare Other | Admitting: *Deleted

## 2018-08-14 ENCOUNTER — Telehealth: Payer: Self-pay | Admitting: Family Medicine

## 2018-08-14 NOTE — Telephone Encounter (Signed)
Called the daughter and discussed form. The form was from the company that supplies his oxygen. I did inform the daughter he needs appt with PCP has not been seen since 05/2017 and needs appt and she can bring in the forms.  She will call back to schedulel

## 2018-08-14 NOTE — Telephone Encounter (Signed)
Patient's daughter would like a call back to discuss forms from [employer] regarding sleep apnea test and oxygen. She's confused about what the patient needs to do in order to have this completed. Please advise.

## 2018-08-20 ENCOUNTER — Telehealth: Payer: Self-pay | Admitting: Family Medicine

## 2018-08-20 NOTE — Telephone Encounter (Signed)
Please see if Derrick Spencer is still being seen here. He is due for AWV w Glenard Haring and also we are receiving a request for an order for oxygen. If he needs this, we need to have him come in for appt to document his oxygen levels. Ty.

## 2018-08-20 NOTE — Telephone Encounter (Signed)
Called and the daughter has scheduled appt already for next week with PCP.

## 2018-08-22 ENCOUNTER — Telehealth: Payer: Self-pay | Admitting: Family Medicine

## 2018-08-22 ENCOUNTER — Other Ambulatory Visit: Payer: Self-pay

## 2018-08-22 NOTE — Telephone Encounter (Signed)
[facility] calling Derrick Spencer) Pt coming in next week for appt with Dr Viona Gilmore. Daughter is wanting on full time oxygen, [employer] is stating that level of oxygen for nocturnal would warrant another sleep study in order for it to be covered by insurance if Questions call Derrick Spencer at [phone]

## 2018-08-22 NOTE — Telephone Encounter (Signed)
Noted. Will discuss at that time.  

## 2018-08-22 NOTE — Telephone Encounter (Signed)
Looks like patient will be in next week to see you.

## 2018-08-26 ENCOUNTER — Ambulatory Visit: Payer: Medicare Other | Admitting: Family Medicine

## 2018-08-27 ENCOUNTER — Other Ambulatory Visit: Payer: Self-pay

## 2018-08-27 ENCOUNTER — Ambulatory Visit (INDEPENDENT_AMBULATORY_CARE_PROVIDER_SITE_OTHER): Payer: Medicare Other | Admitting: Family Medicine

## 2018-08-27 ENCOUNTER — Encounter: Payer: Self-pay | Admitting: Family Medicine

## 2018-08-27 VITALS — BP 132/82 | HR 98 | Temp 98.0°F | Ht 64.0 in | Wt 213.0 lb

## 2018-08-27 DIAGNOSIS — G4733 Obstructive sleep apnea (adult) (pediatric): Secondary | ICD-10-CM

## 2018-08-27 DIAGNOSIS — Z9989 Dependence on other enabling machines and devices: Secondary | ICD-10-CM

## 2018-08-27 DIAGNOSIS — Z23 Encounter for immunization: Secondary | ICD-10-CM

## 2018-08-27 NOTE — Progress Notes (Signed)
CC: F/u  Subjective: Patient is a 85 y.o. male here for OSA. Here w daughter.  Pt was hx of OSA on CPAP nightly. Has not had specialist in many years. Last sleep study was around 10 yrs ago. Ins requesting titrated sleep study. He does not use O2 during the day.   ROS: Heart: Denies chest pain  Lungs: Denies SOB   Past Medical History:  Diagnosis Date  . Cardiac arrhythmia   . H/O hernia repair   . Heart disease   . High blood pressure   . Sleep apnea    CPAP    Objective: BP 132/82 (BP Location: Left Arm, Patient Position: Sitting, Cuff Size: Large)   Pulse 98   Temp 98 F (36.7 C) (Oral)   Ht 5\' 4"  (1.626 m)   Wt 213 lb (96.6 kg)   SpO2 94%   BMI 36.56 kg/m  General: Awake, appears stated age HEENT: MMM, EOMi Heart: RRR, 2+ pitting LE edema Lungs: CTAB, no rales, wheezes or rhonchi. No accessory muscle use Psych: Age appropriate judgment and insight, normal affect and mood  Assessment and Plan: OSA on CPAP - Plan: Ambulatory referral to Pulmonology, will [facility] to get [facility] first, if wait is too long, will refer to [facility]. Resting and ambulatory O2 sats unremarkable.   F/u in 6 mo for AWV w Glenard Haring, prn w me.  The patient voiced understanding and agreement to the plan.  [place], DO 08/27/18  3:37 PM

## 2018-08-27 NOTE — Patient Instructions (Addendum)
If you do not hear anything about your referral in the next 1-2 weeks, call our office and ask for an update.  When Dr. Elonda Husky moves back to the area, let me know and I can put in a referral.   Aim to do some physical exertion for 150 minutes per week. This is typically divided into 5 days per week, 30 minutes per day. The activity should be enough to get your heart rate up. Anything is better than nothing if you have time constraints.  Let us know if you need anything.

## 2018-10-16 DIAGNOSIS — Z23 Encounter for immunization: Secondary | ICD-10-CM | POA: Diagnosis not present

## 2018-10-28 DIAGNOSIS — I509 Heart failure, unspecified: Secondary | ICD-10-CM | POA: Diagnosis not present

## 2018-10-28 DIAGNOSIS — I471 Supraventricular tachycardia: Secondary | ICD-10-CM | POA: Diagnosis not present

## 2018-10-31 DIAGNOSIS — I517 Cardiomegaly: Secondary | ICD-10-CM | POA: Diagnosis not present

## 2018-10-31 DIAGNOSIS — I083 Combined rheumatic disorders of mitral, aortic and tricuspid valves: Secondary | ICD-10-CM | POA: Diagnosis not present

## 2019-02-13 DIAGNOSIS — I509 Heart failure, unspecified: Secondary | ICD-10-CM | POA: Diagnosis not present

## 2019-02-13 DIAGNOSIS — I471 Supraventricular tachycardia: Secondary | ICD-10-CM | POA: Diagnosis not present

## 2019-02-27 ENCOUNTER — Ambulatory Visit: Payer: Medicare Other | Admitting: *Deleted

## 2019-03-06 DIAGNOSIS — I509 Heart failure, unspecified: Secondary | ICD-10-CM | POA: Diagnosis not present

## 2019-03-06 DIAGNOSIS — I471 Supraventricular tachycardia: Secondary | ICD-10-CM | POA: Diagnosis not present

## 2019-07-29 IMAGING — DX DG CHEST 2V
2 series · 2 of 2 positions shown · non-contrast
Comparison: CT scan February 18, 2011

CLINICAL DATA: Nonproductive cough. Shortness of breath and back
pain.

EXAM:
CHEST - 2 VIEW

[chest pa]
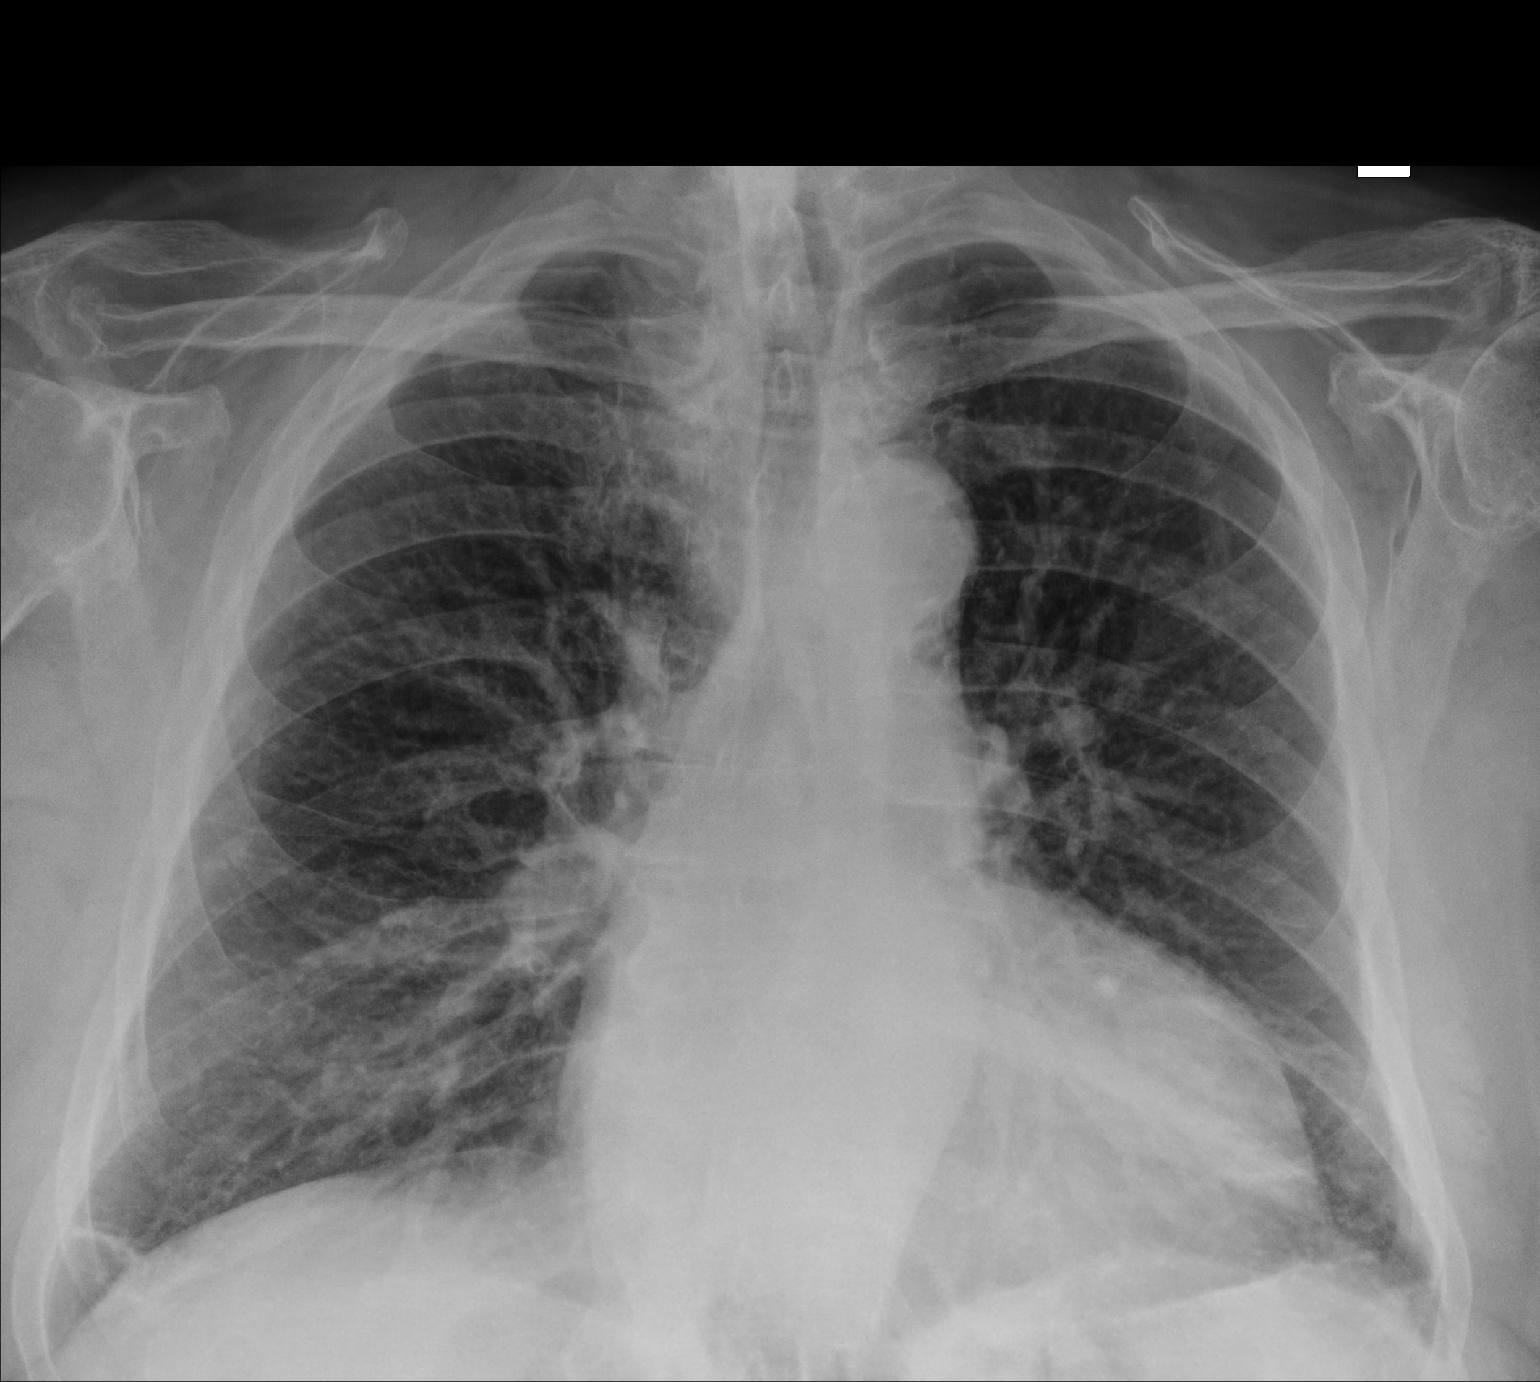

[chest lat]
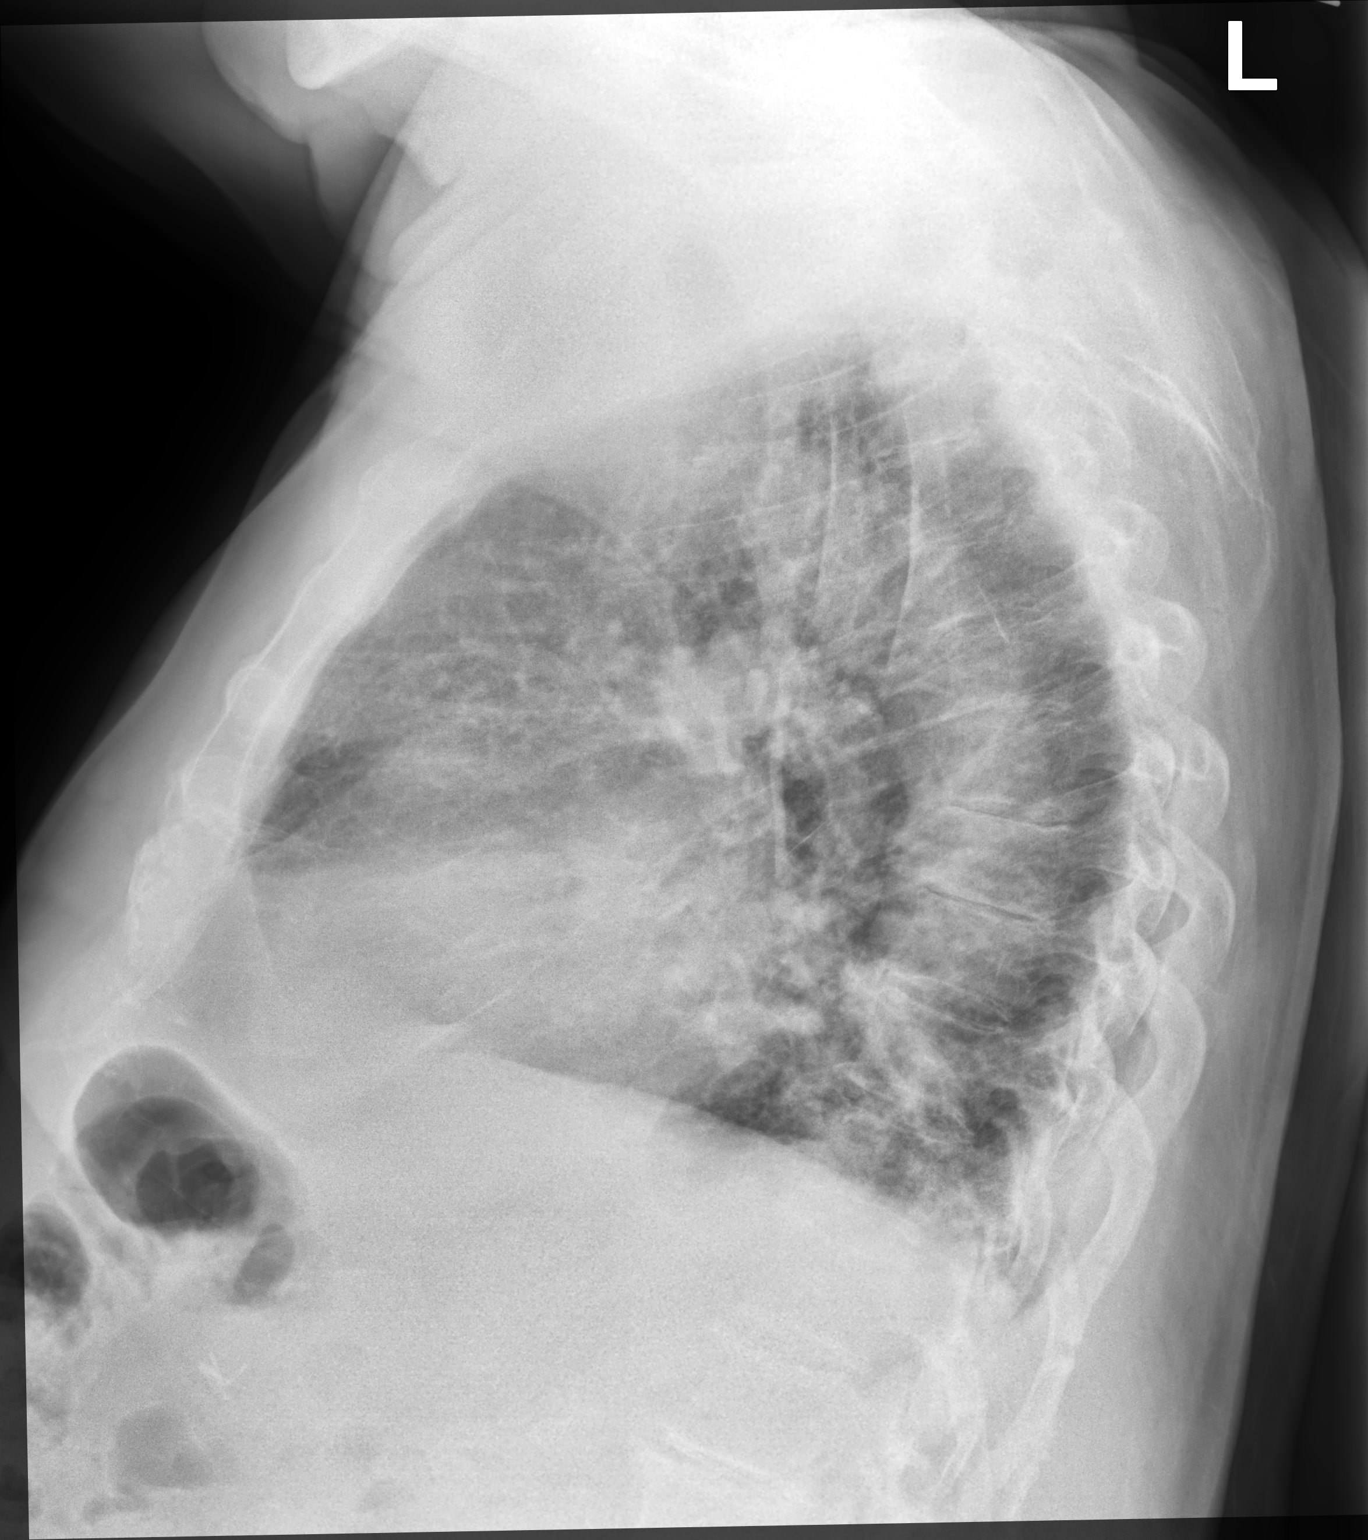

[2 of 2 positions shown; findings below may reference images not displayed]

FINDINGS: Stable cardiomegaly. The hila and mediastinum are normal. Vascular
crowding in the medial right lung base. No pulmonary nodules,
masses, or suspicious infiltrates.
IMPRESSION: No active cardiopulmonary disease.

## 2019-08-10 ENCOUNTER — Emergency Department (HOSPITAL_BASED_OUTPATIENT_CLINIC_OR_DEPARTMENT_OTHER)
Admission: EM | Admit: 2019-08-10 | Discharge: 2019-08-10 | Disposition: A | Discharge status: Home / Self Care | Payer: No Typology Code available for payment source | Attending: Emergency Medicine | Admitting: Emergency Medicine

## 2019-08-10 ENCOUNTER — Encounter (HOSPITAL_BASED_OUTPATIENT_CLINIC_OR_DEPARTMENT_OTHER): Payer: Self-pay | Admitting: Emergency Medicine

## 2019-08-10 ENCOUNTER — Other Ambulatory Visit: Payer: Self-pay

## 2019-08-10 DIAGNOSIS — K625 Hemorrhage of anus and rectum: Secondary | ICD-10-CM | POA: Insufficient documentation

## 2019-08-10 DIAGNOSIS — I4891 Unspecified atrial fibrillation: Secondary | ICD-10-CM | POA: Diagnosis not present

## 2019-08-10 DIAGNOSIS — I509 Heart failure, unspecified: Secondary | ICD-10-CM | POA: Insufficient documentation

## 2019-08-10 LAB — CBC WITH DIFFERENTIAL/PLATELET
Abs Immature Granulocytes: 0.04 10*3/uL (ref 0.00–0.07)
Basophils Absolute: 0.1 10*3/uL (ref 0.0–0.1)
Basophils Relative: 1 %
Eosinophils Absolute: 0.3 10*3/uL (ref 0.0–0.5)
Eosinophils Relative: 4 %
HCT: 35.8 % — ABNORMAL LOW (ref 39.0–52.0)
Hemoglobin: 11.9 g/dL — ABNORMAL LOW (ref 13.0–17.0)
Immature Granulocytes: 0 %
Lymphocytes Relative: 24 %
Lymphs Abs: 2.2 10*3/uL (ref 0.7–4.0)
MCH: 30.4 pg (ref 26.0–34.0)
MCHC: 33.2 g/dL (ref 30.0–36.0)
MCV: 91.3 fL (ref 80.0–100.0)
Monocytes Absolute: 0.8 10*3/uL (ref 0.1–1.0)
Monocytes Relative: 8 %
Neutro Abs: 5.7 10*3/uL (ref 1.7–7.7)
Neutrophils Relative %: 63 %
Platelets: 184 10*3/uL (ref 150–400)
RBC: 3.92 MIL/uL — ABNORMAL LOW (ref 4.22–5.81)
RDW: 13 % (ref 11.5–15.5)
WBC: 9.2 10*3/uL (ref 4.0–10.5)
nRBC: 0 % (ref 0.0–0.2)

## 2019-08-10 LAB — COMPREHENSIVE METABOLIC PANEL
ALT: 12 U/L (ref 0–44)
AST: 18 U/L (ref 15–41)
Albumin: 3.6 g/dL (ref 3.5–5.0)
Alkaline Phosphatase: 52 U/L (ref 38–126)
Anion gap: 11 (ref 5–15)
BUN: 27 mg/dL — ABNORMAL HIGH (ref 8–23)
CO2: 26 mmol/L (ref 22–32)
Calcium: 8.5 mg/dL — ABNORMAL LOW (ref 8.9–10.3)
Chloride: 99 mmol/L (ref 98–111)
Creatinine, Ser: 1.06 mg/dL (ref 0.61–1.24)
GFR calc Af Amer: >60 mL/min (ref >60)
GFR calc non Af Amer: 59 mL/min — ABNORMAL LOW (ref >60)
Glucose, Bld: 110 mg/dL — ABNORMAL HIGH (ref 70–99)
Potassium: 4.3 mmol/L (ref 3.5–5.1)
Sodium: 136 mmol/L (ref 135–145)
Total Bilirubin: 0.4 mg/dL (ref 0.3–1.2)
Total Protein: 6.8 g/dL (ref 6.5–8.1)

## 2019-08-10 LAB — PROTIME-INR
INR: 1.1 (ref 0.8–1.2)
Prothrombin Time: 13.3 seconds (ref 11.4–15.2)

## 2019-08-10 LAB — APTT: aPTT: 30 seconds (ref 24–36)

## 2019-08-10 LAB — OCCULT BLOOD X 1 CARD TO LAB, STOOL: Fecal Occult Bld: POSITIVE — AB

## 2019-08-10 MED ORDER — HYDROCORTISONE ACETATE 25 MG RE SUPP: 25.0000 mg | Freq: Two times a day (BID) | RECTAL | 0 refills | Status: AC

## 2019-08-10 NOTE — Discharge Instructions (Signed)
If the bleeding becomes severe or if you start feeling lightheaded, winded, weak or having any passing out you need to return immediately.

## 2019-08-10 NOTE — ED Triage Notes (Signed)
Pt reports rectal bleeding since yesterday. States bright red and dark stools. Denies pain.

## 2019-08-10 NOTE — ED Provider Notes (Signed)
[facility] EMERGENCY DEPARTMENT Provider Note   CSN: [id number] Arrival date & time: 08/10/19  2008     History Chief Complaint  Patient presents with  . Rectal Bleeding    Derrick Spencer is a 86 y.o. male.  The history is provided by the patient.  Rectal Bleeding Quality: bright and dark red blood. Amount:  Moderate Duration:  2 days Timing:  Intermittent Chronicity:  New Context: spontaneously   Context: not rectal pain   Similar prior episodes: yes (hemorrhoids years ago but nothing recently)   Relieved by:  None tried Worsened by:  Nothing Ineffective treatments:  None tried Associated symptoms: no abdominal pain, no dizziness, no fever, no light-headedness and no loss of consciousness   Associated symptoms comment:  Patient has had 5 episodes of bloody stool total.  He states today the last bloody bowel movement he had was around 3:00.  However today at day the blood looked dark.  He has not noticed if there is been any clots.  Patient does not take anticoagulants Risk factors comment:  Patient with history of atrial fibrillation but does not take anticoagulation.      Past Medical History:  Diagnosis Date  . Cardiac arrhythmia   . H/O hernia repair   . Heart disease   . High blood pressure   . Sleep apnea    CPAP    Patient Active Problem List   Diagnosis Date Noted  . OSA on CPAP 08/27/2018  . CHF exacerbation (HCC) 12/10/2017  . Acute low back pain 12/10/2017  . Irregular heartbeat 05/25/2017  . Seasonal allergies 05/25/2017  . Bilateral hearing loss 05/25/2017  . Atrial fibrillation (HCC) 05/25/2017    Past Surgical History:  Procedure Laterality Date  . HERNIA REPAIR         Family History  Problem Relation Age of Onset  . Arthritis Mother   . Hearing loss Mother   . Arthritis Father   . Diabetes Daughter     Social History   Tobacco Use  . Smoking status: Never Smoker  . Smokeless tobacco: Never Used  Vaping Use  .  Vaping Use: Never used  Substance Use Topics  . Alcohol use: Never  . Drug use: Never    Home Medications Prior to Admission medications   Medication Sig Start Date End Date Taking? Authorizing Provider  Ascorbic Acid (VITAMIN C) 100 MG tablet Take 100 mg by mouth daily.    [provider]  aspirin EC 81 MG tablet Take 81 mg by mouth daily.    [provider]  FLUAD 0.5 ML SUSY TO BE ADMINISTERED BY PHARMACIST FOR IMMUNIZATION 11/05/17   [provider]  furosemide (LASIX) 20 MG tablet Take 10 mg by mouth daily.    [provider]  Omega-3 Fatty Acids (FISH OIL) 1000 MG CAPS Take by mouth daily.    [provider]  Omeprazole 20 MG TBEC Take 20 mg by mouth daily as needed. 11/19/07   [provider]  tamsulosin (FLOMAX) 0.4 MG CAPS capsule Take 0.4 mg by mouth daily. 02/22/11   [provider]  valsartan (DIOVAN) 40 MG tablet Take 40 mg by mouth 2 (two) times daily. Meds delivered as 80mg  tabs, pt. Cuts in half 08/24/04   [provider]    Allergies    Patient has no known allergies.  Review of Systems   Review of Systems  Constitutional: Negative for fever.  Gastrointestinal: Positive for hematochezia. Negative for  abdominal pain.  Neurological: Negative for dizziness, loss of consciousness and light-headedness.  All other systems reviewed and are negative.   Physical Exam Updated Vital Signs BP 132/72   Pulse 88   Temp 98.5 F (36.9 C)   Resp (!) 24   Wt 94.4 kg   SpO2 97%   BMI 35.74 kg/m   Physical Exam Vitals and nursing note reviewed.  Constitutional:      General: He is not in acute distress.    Appearance: Normal appearance. He is well-developed.  HENT:     Head: Normocephalic and atraumatic.  Eyes:     Conjunctiva/sclera: Conjunctivae normal.     Pupils: Pupils are equal, round, and reactive to light.  Cardiovascular:     Rate and Rhythm: Normal rate and regular rhythm.     Heart  sounds: No murmur heard.   Pulmonary:     Effort: Pulmonary effort is normal. No respiratory distress.     Breath sounds: Normal breath sounds. No wheezing or rales.  Abdominal:     General: There is no distension.     Palpations: Abdomen is soft.     Tenderness: There is no abdominal tenderness. There is no guarding or rebound.  Genitourinary:    Rectum: Guaiac result positive.     Comments: No hemorrhoids present.  Light blood tinge on rectal exam Musculoskeletal:        General: No tenderness. Normal range of motion.     Cervical back: Normal range of motion and neck supple.     Right lower leg: No edema.     Left lower leg: No edema.  Skin:    General: Skin is warm and dry.     Findings: No erythema or rash.  Neurological:     General: No focal deficit present.     Mental Status: He is alert and oriented to person, place, and time. Mental status is at baseline.  Psychiatric:        Mood and Affect: Mood normal.        Behavior: Behavior normal.        Thought Content: Thought content normal.     ED Results / Procedures / Treatments   Labs (all labs ordered are listed, but only abnormal results are displayed) Labs Reviewed  CBC WITH DIFFERENTIAL/PLATELET - Abnormal; Notable for the following components:      Result Value   RBC 3.92 (*)    Hemoglobin 11.9 (*)    HCT 35.8 (*)    All other components within normal limits  OCCULT BLOOD X 1 CARD TO LAB, STOOL - Abnormal; Notable for the following components:   Fecal Occult Bld POSITIVE (*)    All other components within normal limits  COMPREHENSIVE METABOLIC PANEL  APTT  PROTIME-INR  POC OCCULT BLOOD, ED    EKG None  Radiology No results found.  Procedures Procedures (including critical care time)  Medications Ordered in ED Medications - No data to display  ED Course  I have reviewed the triage vital signs and the nursing notes.  Pertinent labs & imaging results that were available during my care of the  patient were reviewed by me and considered in my medical decision making (see chart for details).    MDM Rules/Calculators/A&P                          Patient presenting with rectal bleeding that started 3 days ago and has persisted.  He denies any abdominal pain, weakness or shortness of breath but patient is mildly tachycardic here.  Patient is in atrial fibrillation but that is not a new symptom.  Hemoglobin today is 11.9 and he is Hemoccult positive.  Patient is otherwise well-appearing and walked in without acute issues.    9:39 PM Hemoccult is positive but only faintly pink on exam.  Patient's hemoglobin is stable at 11.9.  CMP without acute findings today.  Patient initially was slightly tachycardic upon arrival here but is now in the 80s.  Discussed with he and his daughter the option of admission with GI consult and observation however patient desires to go home.  Patient and daughter both understand that bleeding could worsen and he could potentially decline at home and decompensate but he states he would still like to go home and call his doctor in the morning.  Daughter is present and also comfortable with this plan.  Pt started on anusol suppository in case of internal hemorrhoids.  Final Clinical Impression(s) / ED Diagnoses Final diagnoses:  Rectal bleeding    Rx / DC Orders ED Discharge Orders         Ordered    hydrocortisone (ANUSOL-HC) 25 MG suppository  2 times daily     Discontinue  Reprint     08/10/19 2206           Gwyneth Sprout, MD 08/10/19 2335

## 2019-08-11 ENCOUNTER — Telehealth: Payer: Self-pay

## 2019-08-11 NOTE — Telephone Encounter (Signed)
Nurse Assessment Nurse: Pollyann Savoy, RN, Melissa Date/Time (Eastern Time): 08/10/2019 6:44:07 PM Confirm and document reason for call. If symptomatic, describe symptoms. ---Caller states her father has started passing blood in his stool. Caller states her father has a history of Hemorrhoids. Sxs started Fri night-occurs with bowel movements-occurred x 5-6 times. Denies constipation. Diarrhea started Friday. Denies fever, cough or sob or vomiting. Has the patient had close contact with a person known or suspected to have the novel coronavirus illness OR traveled / lives in area with major community spread (including international travel) in the last 14 days from the onset of symptoms? * If Asymptomatic, screen for exposure and travel within the last 14 days. ---No Does the patient have any new or worsening symptoms? ---Yes Will a triage be completed? ---Yes Related visit to physician within the last 2 weeks? ---No Does the PT have any chronic conditions? (i.e. diabetes, asthma, this includes High risk factors for pregnancy, etc.) ---Yes List chronic conditions. ---CHF, denies hemmorhoids, OSA-C-PAP. Is this a behavioral health or substance abuse call? ---No Guidelines Guideline Title Affirmed Question Affirmed Notes Nurse Date/Time (Eastern Time) Rectal Bleeding [1] MODERATE rectal bleeding (small blood clots, passing blood Pollyann Savoy, RN, Melissa 08/10/2019 7:13:56 PMPLEASE NOTE: All timestamps contained within this report are represented as [place] Standard Time. CONFIDENTIALTY NOTICE: This fax transmission is intended only for the addressee. It contains information that is legally privileged, confidential or otherwise protected from use or disclosure. If you are not the intended recipient, you are strictly prohibited from reviewing, disclosing, copying using or disseminating any of this information or taking any action in reliance on or regarding this information. If you have received this fax  in error, please notify us immediately by telephone so that we can arrange for its return to [place]. Phone: [phone], Toll-Free: [phone], Fax: [phone] Page: 2 of 2 Call Id: [id number] Guidelines Guideline Title Affirmed Question Affirmed Notes Nurse Date/Time Lamount Cohen Time) without stool, or toilet water turns red) AND [2] more than once a day Disp. Time Lamount Cohen Time) Disposition Final User 08/10/2019 6:28:08 PM Attempt made - message left Zayas, RN, Melissa 08/10/2019 7:17:12 PM Go to ED Now Yes Zayas, RN, Efraim Kaufmann Caller Disagree/Comply Comply Caller Understands Yes PreDisposition InappropriateToAsk Care Advice Given Per Guideline GO TO ED NOW: * You need to be seen in the Emergency Department. * Go to the ED at ___________ Hospital. * Leave now. Drive carefully. DRIVING: * Another adult should drive. * Do not delay going to the Emergency Department. If immediate transportation is not available via car or taxi, then the patient should be instructed to call [id number]. CARE ADVICE given per Rectal Bleeding (Adult) guideline. Comments User: Susa Loffler, RN Date/Time Lamount Cohen Time): 08/10/2019 7:08:26 PM Pt contact [phone]. Referrals [facility] - ED

## 2019-09-16 DIAGNOSIS — I509 Heart failure, unspecified: Secondary | ICD-10-CM | POA: Diagnosis not present

## 2019-09-16 DIAGNOSIS — I471 Supraventricular tachycardia: Secondary | ICD-10-CM | POA: Diagnosis not present

## 2019-11-28 DIAGNOSIS — Z23 Encounter for immunization: Secondary | ICD-10-CM | POA: Diagnosis not present

## 2019-12-13 DIAGNOSIS — Z23 Encounter for immunization: Secondary | ICD-10-CM | POA: Diagnosis not present

## 2019-12-29 DIAGNOSIS — N400 Enlarged prostate without lower urinary tract symptoms: Secondary | ICD-10-CM | POA: Diagnosis present

## 2019-12-29 DIAGNOSIS — J45909 Unspecified asthma, uncomplicated: Secondary | ICD-10-CM | POA: Diagnosis present

## 2019-12-29 DIAGNOSIS — I4821 Permanent atrial fibrillation: Secondary | ICD-10-CM | POA: Diagnosis present

## 2019-12-29 DIAGNOSIS — J069 Acute upper respiratory infection, unspecified: Secondary | ICD-10-CM | POA: Diagnosis present

## 2019-12-29 DIAGNOSIS — I452 Bifascicular block: Secondary | ICD-10-CM | POA: Diagnosis present

## 2019-12-29 DIAGNOSIS — N1832 Chronic kidney disease, stage 3b: Secondary | ICD-10-CM | POA: Diagnosis present

## 2019-12-29 DIAGNOSIS — R0902 Hypoxemia: Secondary | ICD-10-CM | POA: Diagnosis present

## 2019-12-29 DIAGNOSIS — R0789 Other chest pain: Secondary | ICD-10-CM | POA: Diagnosis not present

## 2019-12-29 DIAGNOSIS — Z9989 Dependence on other enabling machines and devices: Secondary | ICD-10-CM | POA: Diagnosis not present

## 2019-12-29 DIAGNOSIS — R0609 Other forms of dyspnea: Secondary | ICD-10-CM | POA: Diagnosis not present

## 2019-12-29 DIAGNOSIS — Z20822 Contact with and (suspected) exposure to covid-19: Secondary | ICD-10-CM | POA: Diagnosis present

## 2019-12-29 DIAGNOSIS — G4733 Obstructive sleep apnea (adult) (pediatric): Secondary | ICD-10-CM | POA: Diagnosis present

## 2019-12-29 DIAGNOSIS — I4510 Unspecified right bundle-branch block: Secondary | ICD-10-CM | POA: Diagnosis not present

## 2019-12-29 DIAGNOSIS — I5032 Chronic diastolic (congestive) heart failure: Secondary | ICD-10-CM | POA: Diagnosis present

## 2019-12-29 DIAGNOSIS — I130 Hypertensive heart and chronic kidney disease with heart failure and stage 1 through stage 4 chronic kidney disease, or unspecified chronic kidney disease: Secondary | ICD-10-CM | POA: Diagnosis not present

## 2019-12-29 DIAGNOSIS — R0600 Dyspnea, unspecified: Secondary | ICD-10-CM | POA: Diagnosis not present

## 2019-12-29 DIAGNOSIS — R079 Chest pain, unspecified: Secondary | ICD-10-CM | POA: Diagnosis not present

## 2019-12-29 DIAGNOSIS — R0602 Shortness of breath: Secondary | ICD-10-CM | POA: Diagnosis not present

## 2019-12-29 DIAGNOSIS — Z66 Do not resuscitate: Secondary | ICD-10-CM | POA: Diagnosis present

## 2019-12-29 DIAGNOSIS — I4891 Unspecified atrial fibrillation: Secondary | ICD-10-CM | POA: Diagnosis not present

## 2019-12-29 DIAGNOSIS — I509 Heart failure, unspecified: Secondary | ICD-10-CM | POA: Diagnosis not present

## 2019-12-29 DIAGNOSIS — N189 Chronic kidney disease, unspecified: Secondary | ICD-10-CM | POA: Diagnosis not present

## 2019-12-29 DIAGNOSIS — R001 Bradycardia, unspecified: Secondary | ICD-10-CM | POA: Diagnosis present

## 2019-12-29 DIAGNOSIS — Z006 Encounter for examination for normal comparison and control in clinical research program: Secondary | ICD-10-CM | POA: Diagnosis not present

## 2019-12-29 DIAGNOSIS — J811 Chronic pulmonary edema: Secondary | ICD-10-CM | POA: Diagnosis not present

## 2019-12-29 DIAGNOSIS — N179 Acute kidney failure, unspecified: Secondary | ICD-10-CM | POA: Diagnosis present

## 2019-12-29 DIAGNOSIS — I4819 Other persistent atrial fibrillation: Secondary | ICD-10-CM | POA: Diagnosis not present

## 2019-12-29 DIAGNOSIS — K219 Gastro-esophageal reflux disease without esophagitis: Secondary | ICD-10-CM | POA: Diagnosis present

## 2019-12-29 DIAGNOSIS — I493 Ventricular premature depolarization: Secondary | ICD-10-CM | POA: Diagnosis not present

## 2019-12-29 DIAGNOSIS — R42 Dizziness and giddiness: Secondary | ICD-10-CM | POA: Diagnosis not present

## 2019-12-29 DIAGNOSIS — T460X5A Adverse effect of cardiac-stimulant glycosides and drugs of similar action, initial encounter: Secondary | ICD-10-CM | POA: Diagnosis present

## 2019-12-29 DIAGNOSIS — Z7982 Long term (current) use of aspirin: Secondary | ICD-10-CM | POA: Diagnosis not present

## 2019-12-29 DIAGNOSIS — I4892 Unspecified atrial flutter: Secondary | ICD-10-CM | POA: Diagnosis present

## 2019-12-29 DIAGNOSIS — Z950 Presence of cardiac pacemaker: Secondary | ICD-10-CM | POA: Diagnosis not present

## 2019-12-29 DIAGNOSIS — I272 Pulmonary hypertension, unspecified: Secondary | ICD-10-CM | POA: Diagnosis present

## 2019-12-29 DIAGNOSIS — I081 Rheumatic disorders of both mitral and tricuspid valves: Secondary | ICD-10-CM | POA: Diagnosis present

## 2019-12-29 DIAGNOSIS — Z79899 Other long term (current) drug therapy: Secondary | ICD-10-CM | POA: Diagnosis not present

## 2019-12-29 DIAGNOSIS — Z6836 Body mass index (BMI) 36.0-36.9, adult: Secondary | ICD-10-CM | POA: Diagnosis not present

## 2019-12-30 DIAGNOSIS — R0609 Other forms of dyspnea: Secondary | ICD-10-CM | POA: Diagnosis not present

## 2019-12-30 DIAGNOSIS — I272 Pulmonary hypertension, unspecified: Secondary | ICD-10-CM | POA: Diagnosis not present

## 2019-12-30 DIAGNOSIS — N179 Acute kidney failure, unspecified: Secondary | ICD-10-CM | POA: Diagnosis not present

## 2019-12-30 DIAGNOSIS — N1832 Chronic kidney disease, stage 3b: Secondary | ICD-10-CM | POA: Diagnosis not present

## 2019-12-30 DIAGNOSIS — G4733 Obstructive sleep apnea (adult) (pediatric): Secondary | ICD-10-CM | POA: Diagnosis not present

## 2019-12-30 DIAGNOSIS — Z9989 Dependence on other enabling machines and devices: Secondary | ICD-10-CM | POA: Diagnosis not present

## 2019-12-30 DIAGNOSIS — I4821 Permanent atrial fibrillation: Secondary | ICD-10-CM | POA: Diagnosis not present

## 2019-12-31 DIAGNOSIS — R0609 Other forms of dyspnea: Secondary | ICD-10-CM | POA: Diagnosis not present

## 2019-12-31 DIAGNOSIS — I4821 Permanent atrial fibrillation: Secondary | ICD-10-CM | POA: Diagnosis not present

## 2019-12-31 DIAGNOSIS — I5032 Chronic diastolic (congestive) heart failure: Secondary | ICD-10-CM | POA: Diagnosis not present

## 2019-12-31 DIAGNOSIS — G4733 Obstructive sleep apnea (adult) (pediatric): Secondary | ICD-10-CM | POA: Diagnosis not present

## 2019-12-31 DIAGNOSIS — N189 Chronic kidney disease, unspecified: Secondary | ICD-10-CM | POA: Diagnosis not present

## 2019-12-31 DIAGNOSIS — R001 Bradycardia, unspecified: Secondary | ICD-10-CM | POA: Diagnosis not present

## 2019-12-31 DIAGNOSIS — I272 Pulmonary hypertension, unspecified: Secondary | ICD-10-CM | POA: Diagnosis not present

## 2019-12-31 DIAGNOSIS — N179 Acute kidney failure, unspecified: Secondary | ICD-10-CM | POA: Diagnosis not present

## 2019-12-31 DIAGNOSIS — Z9989 Dependence on other enabling machines and devices: Secondary | ICD-10-CM | POA: Diagnosis not present

## 2019-12-31 DIAGNOSIS — N1832 Chronic kidney disease, stage 3b: Secondary | ICD-10-CM | POA: Diagnosis not present

## 2020-01-01 DIAGNOSIS — I4892 Unspecified atrial flutter: Secondary | ICD-10-CM | POA: Diagnosis not present

## 2020-01-01 DIAGNOSIS — J811 Chronic pulmonary edema: Secondary | ICD-10-CM | POA: Diagnosis not present

## 2020-01-01 DIAGNOSIS — I4821 Permanent atrial fibrillation: Secondary | ICD-10-CM | POA: Diagnosis not present

## 2020-01-01 DIAGNOSIS — R0600 Dyspnea, unspecified: Secondary | ICD-10-CM | POA: Diagnosis not present

## 2020-01-01 DIAGNOSIS — I4819 Other persistent atrial fibrillation: Secondary | ICD-10-CM | POA: Diagnosis not present

## 2020-01-02 DIAGNOSIS — I5032 Chronic diastolic (congestive) heart failure: Secondary | ICD-10-CM | POA: Diagnosis not present

## 2020-01-02 DIAGNOSIS — N189 Chronic kidney disease, unspecified: Secondary | ICD-10-CM | POA: Diagnosis not present

## 2020-01-02 DIAGNOSIS — R001 Bradycardia, unspecified: Secondary | ICD-10-CM | POA: Diagnosis not present

## 2020-01-02 DIAGNOSIS — N179 Acute kidney failure, unspecified: Secondary | ICD-10-CM | POA: Diagnosis not present

## 2020-01-03 DIAGNOSIS — N189 Chronic kidney disease, unspecified: Secondary | ICD-10-CM | POA: Diagnosis not present

## 2020-01-03 DIAGNOSIS — I5032 Chronic diastolic (congestive) heart failure: Secondary | ICD-10-CM | POA: Diagnosis not present

## 2020-01-03 DIAGNOSIS — N179 Acute kidney failure, unspecified: Secondary | ICD-10-CM | POA: Diagnosis not present

## 2020-01-03 DIAGNOSIS — R001 Bradycardia, unspecified: Secondary | ICD-10-CM | POA: Diagnosis not present

## 2020-01-04 DIAGNOSIS — J45909 Unspecified asthma, uncomplicated: Secondary | ICD-10-CM | POA: Diagnosis not present

## 2020-01-04 DIAGNOSIS — R0600 Dyspnea, unspecified: Secondary | ICD-10-CM | POA: Diagnosis not present

## 2020-01-04 DIAGNOSIS — R001 Bradycardia, unspecified: Secondary | ICD-10-CM | POA: Diagnosis not present

## 2020-01-04 DIAGNOSIS — Z950 Presence of cardiac pacemaker: Secondary | ICD-10-CM | POA: Diagnosis not present

## 2020-01-05 DIAGNOSIS — I4891 Unspecified atrial fibrillation: Secondary | ICD-10-CM | POA: Diagnosis not present

## 2020-01-12 DIAGNOSIS — I4891 Unspecified atrial fibrillation: Secondary | ICD-10-CM | POA: Diagnosis not present

## 2020-01-12 DIAGNOSIS — Z45018 Encounter for adjustment and management of other part of cardiac pacemaker: Secondary | ICD-10-CM | POA: Diagnosis not present

## 2020-01-27 DIAGNOSIS — I509 Heart failure, unspecified: Secondary | ICD-10-CM | POA: Diagnosis not present

## 2020-01-27 DIAGNOSIS — I471 Supraventricular tachycardia: Secondary | ICD-10-CM | POA: Diagnosis not present

## 2020-01-27 DIAGNOSIS — Z950 Presence of cardiac pacemaker: Secondary | ICD-10-CM | POA: Diagnosis not present

## 2020-01-27 DIAGNOSIS — R001 Bradycardia, unspecified: Secondary | ICD-10-CM | POA: Diagnosis not present

## 2020-01-27 DIAGNOSIS — I2721 Secondary pulmonary arterial hypertension: Secondary | ICD-10-CM | POA: Diagnosis not present

## 2020-01-27 DIAGNOSIS — I340 Nonrheumatic mitral (valve) insufficiency: Secondary | ICD-10-CM | POA: Diagnosis not present

## 2020-02-17 ENCOUNTER — Encounter (HOSPITAL_BASED_OUTPATIENT_CLINIC_OR_DEPARTMENT_OTHER): Payer: Self-pay | Admitting: *Deleted

## 2020-02-17 ENCOUNTER — Other Ambulatory Visit: Payer: Self-pay

## 2020-02-17 DIAGNOSIS — M533 Sacrococcygeal disorders, not elsewhere classified: Secondary | ICD-10-CM | POA: Diagnosis not present

## 2020-02-17 DIAGNOSIS — M549 Dorsalgia, unspecified: Secondary | ICD-10-CM | POA: Diagnosis present

## 2020-02-17 DIAGNOSIS — Z7982 Long term (current) use of aspirin: Secondary | ICD-10-CM | POA: Insufficient documentation

## 2020-02-17 LAB — URINALYSIS, ROUTINE W REFLEX MICROSCOPIC
Bilirubin Urine: NEGATIVE
Glucose, UA: NEGATIVE mg/dL
Hgb urine dipstick: NEGATIVE
Ketones, ur: NEGATIVE mg/dL
Leukocytes,Ua: NEGATIVE
Nitrite: NEGATIVE
Protein, ur: NEGATIVE mg/dL
Specific Gravity, Urine: 1.01 (ref 1.005–1.030)
pH: 6 (ref 5.0–8.0)

## 2020-02-17 NOTE — ED Triage Notes (Signed)
Sharp pain in his lower back that started yesterday. Movement increases pain.

## 2020-02-18 ENCOUNTER — Emergency Department (HOSPITAL_BASED_OUTPATIENT_CLINIC_OR_DEPARTMENT_OTHER)
Admission: EM | Admit: 2020-02-18 | Discharge: 2020-02-18 | Disposition: A | Discharge status: Home / Self Care | Payer: No Typology Code available for payment source | Attending: Emergency Medicine | Admitting: Emergency Medicine

## 2020-02-18 DIAGNOSIS — M533 Sacrococcygeal disorders, not elsewhere classified: Secondary | ICD-10-CM

## 2020-02-18 MED ORDER — MELOXICAM 7.5 MG PO TABS: ORAL_TABLET | ORAL | 0 refills | Status: AC

## 2020-02-18 MED ORDER — NAPROXEN 250 MG PO TABS
500.0000 mg | ORAL_TABLET | Freq: Once | ORAL | Status: AC
  Administered 2020-02-18: 500 mg via ORAL
  Filled 2020-02-18: qty 2

## 2020-02-18 NOTE — ED Provider Notes (Signed)
[facility] Provider Note: Lowella Dell, MD, FACEP  CSN: [id number] MRN: [id number] ARRIVAL: 02/17/20 at 2015 ROOM: [facility]   CHIEF COMPLAINT  Back Pain   HISTORY OF PRESENT ILLNESS  02/18/20 4:26 AM Derrick Spencer is a 87 y.o. male with pain in his right sacroiliac region that began 2 days ago.  It was more severe yesterday but now it has improved with rest.  He rates it as a 3 out of 10 currently.  It is worse with ambulation or movement of the right hip.  It has radiated around to his right groin at times.  He denies any inciting injury.  He has had no hematuria.   Past Medical History:  Diagnosis Date  . Cardiac arrhythmia   . H/O hernia repair   . Heart disease   . High blood pressure   . Sleep apnea    CPAP    Past Surgical History:  Procedure Laterality Date  . HERNIA REPAIR      Family History  Problem Relation Age of Onset  . Arthritis Mother   . Hearing loss Mother   . Arthritis Father   . Diabetes Daughter     Social History   Tobacco Use  . Smoking status: Never Smoker  . Smokeless tobacco: Never Used  Vaping Use  . Vaping Use: Never used  Substance Use Topics  . Alcohol use: Never  . Drug use: Never    Prior to Admission medications   Medication Sig Start Date End Date Taking? Authorizing Provider  Ascorbic Acid (VITAMIN C) 100 MG tablet Take 100 mg by mouth daily.   Yes [provider]  aspirin EC 81 MG tablet Take 81 mg by mouth daily.   Yes [provider]  furosemide (LASIX) 20 MG tablet Take 10 mg by mouth daily.   Yes [provider]  hydrocortisone (ANUSOL-HC) 25 MG suppository Place 1 suppository (25 mg total) rectally 2 (two) times daily. 08/10/19  Yes Plunkett, Alphonzo Lemmings, MD  meloxicam (MOBIC) 7.5 MG tablet Take 1 tablet daily as needed for sacroiliac pain. 02/18/20  Yes Monseratt Ledin, MD  Omega-3 Fatty Acids (FISH OIL) 1000 MG CAPS Take by mouth daily.   Yes [provider]  Omeprazole  20 MG TBEC Take 20 mg by mouth daily as needed. 11/19/07  Yes [provider]  tamsulosin (FLOMAX) 0.4 MG CAPS capsule Take 0.4 mg by mouth daily. 02/22/11  Yes [provider]  valsartan (DIOVAN) 40 MG tablet Take 40 mg by mouth 2 (two) times daily. Meds delivered as 80mg  tabs, pt. Cuts in half 08/24/04  Yes [provider]  FLUAD 0.5 ML SUSY TO BE ADMINISTERED BY PHARMACIST FOR IMMUNIZATION 11/05/17   [provider]    Allergies Patient has no known allergies.   REVIEW OF SYSTEMS  Negative except as noted here or in the History of Present Illness.   PHYSICAL EXAMINATION  Initial Vital Signs Blood pressure 129/82, pulse 71, temperature 98.5 F (36.9 C), temperature source Oral, resp. rate 17, height 5\' 4"  (1.626 m), weight 94.3 kg, SpO2 97 %.  Examination General: Well-developed, well-nourished male in no acute distress; appearance consistent with age of record HENT: normocephalic; atraumatic Eyes: pupils equal, round and reactive to light; extraocular muscles intact Neck: supple Heart: regular rate and rhythm Lungs: clear to auscultation bilaterally Abdomen: soft; nondistended; nontender; bowel sounds present Back: Right sacroiliac tenderness, pain on right straight leg raise Extremities: No deformity; full range of motion;  2+ pitting edema of lower legs Neurologic: Awake, alert and oriented; motor function intact in all extremities and symmetric; no facial droop; hard of hearing Skin: Warm and dry Psychiatric: Normal mood and affect   RESULTS  Summary of this visit's results, reviewed and interpreted by myself:   EKG Interpretation  Date/Time:    Ventricular Rate:    PR Interval:    QRS Duration:   QT Interval:    QTC Calculation:   R Axis:     Text Interpretation:        Laboratory Studies: Results for orders placed or performed during the hospital encounter of 02/18/20 (from the past 24 hour(s))  Urinalysis, Routine w reflex  microscopic Urine, Clean Catch     Status: None   Collection Time: 02/17/20  9:45 PM  Result Value Ref Range   Color, Urine YELLOW YELLOW   APPearance CLEAR CLEAR   Specific Gravity, Urine 1.010 1.005 - 1.030   pH 6.0 5.0 - 8.0   Glucose, UA NEGATIVE NEGATIVE mg/dL   Hgb urine dipstick NEGATIVE NEGATIVE   Bilirubin Urine NEGATIVE NEGATIVE   Ketones, ur NEGATIVE NEGATIVE mg/dL   Protein, ur NEGATIVE NEGATIVE mg/dL   Nitrite NEGATIVE NEGATIVE   Leukocytes,Ua NEGATIVE NEGATIVE   Imaging Studies: No results found.  ED COURSE and MDM  Nursing notes, initial and subsequent vitals signs, including pulse oximetry, reviewed and interpreted by myself.  Vitals:   02/17/20 2036 02/17/20 2039 02/18/20 0021 02/18/20 0426  BP:  129/66 129/82 129/60  Pulse:  70 71 82  Resp:  18 17 16   Temp:  98.5 F (36.9 C)  97.6 F (36.4 C)  TempSrc:  Oral  Oral  SpO2:  98% 97% 99%  Weight: 94.3 kg     Height: 5\' 4"  (1.626 m)      Medications  naproxen (NAPROSYN) tablet 500 mg (has no administration in time range)    Location and nature of pain is consistent with sacroiliitis.  I do not believe this is a kidney stone as the pain is too low, it is worse with movement and he has no hematuria.  PROCEDURES  Procedures   ED DIAGNOSES     ICD-10-CM   1. Sacroiliac joint pain  M53.3        Anu Stagner, , MD 02/18/20 [phone]

## 2020-02-18 NOTE — ED Notes (Signed)
ED Provider at bedside. 

## 2020-03-01 DIAGNOSIS — I495 Sick sinus syndrome: Secondary | ICD-10-CM | POA: Diagnosis not present

## 2020-04-08 DIAGNOSIS — M79675 Pain in left toe(s): Secondary | ICD-10-CM | POA: Diagnosis not present

## 2020-04-08 DIAGNOSIS — M79674 Pain in right toe(s): Secondary | ICD-10-CM | POA: Diagnosis not present

## 2020-04-08 DIAGNOSIS — B351 Tinea unguium: Secondary | ICD-10-CM | POA: Diagnosis not present

## 2020-04-27 DIAGNOSIS — I2721 Secondary pulmonary arterial hypertension: Secondary | ICD-10-CM | POA: Diagnosis not present

## 2020-04-27 DIAGNOSIS — Z950 Presence of cardiac pacemaker: Secondary | ICD-10-CM | POA: Diagnosis not present

## 2020-04-27 DIAGNOSIS — I509 Heart failure, unspecified: Secondary | ICD-10-CM | POA: Diagnosis not present

## 2020-04-27 DIAGNOSIS — I495 Sick sinus syndrome: Secondary | ICD-10-CM | POA: Diagnosis not present

## 2020-04-27 DIAGNOSIS — R001 Bradycardia, unspecified: Secondary | ICD-10-CM | POA: Diagnosis not present

## 2020-04-27 DIAGNOSIS — I471 Supraventricular tachycardia: Secondary | ICD-10-CM | POA: Diagnosis not present

## 2020-04-27 DIAGNOSIS — I340 Nonrheumatic mitral (valve) insufficiency: Secondary | ICD-10-CM | POA: Diagnosis not present

## 2020-10-28 DIAGNOSIS — R001 Bradycardia, unspecified: Secondary | ICD-10-CM | POA: Diagnosis not present

## 2020-10-28 DIAGNOSIS — Z950 Presence of cardiac pacemaker: Secondary | ICD-10-CM | POA: Diagnosis not present

## 2020-10-28 DIAGNOSIS — I509 Heart failure, unspecified: Secondary | ICD-10-CM | POA: Diagnosis not present

## 2020-10-28 DIAGNOSIS — I471 Supraventricular tachycardia: Secondary | ICD-10-CM | POA: Diagnosis not present

## 2020-10-28 DIAGNOSIS — I495 Sick sinus syndrome: Secondary | ICD-10-CM | POA: Diagnosis not present
